# Patient Record
Sex: Female | Born: 1955 | Race: White | Hispanic: No | Marital: Married | Smoking: Former smoker
Health system: Southern US, Community
[De-identification: ages and names within clinical notes are randomized; demographics above are authoritative.]

## PROBLEM LIST (undated history)

## (undated) DIAGNOSIS — E079 Disorder of thyroid, unspecified: Secondary | ICD-10-CM

## (undated) DIAGNOSIS — I10 Essential (primary) hypertension: Secondary | ICD-10-CM

## (undated) DIAGNOSIS — C801 Malignant (primary) neoplasm, unspecified: Secondary | ICD-10-CM

## (undated) DIAGNOSIS — E7800 Pure hypercholesterolemia, unspecified: Secondary | ICD-10-CM

## (undated) HISTORY — PX: BACK SURGERY: SHX140

---

## 1999-11-15 ENCOUNTER — Other Ambulatory Visit: Admission: RE | Admit: 1999-11-15 | Discharge: 1999-11-15 | Payer: Self-pay | Admitting: *Deleted

## 2011-06-24 ENCOUNTER — Emergency Department (INDEPENDENT_AMBULATORY_CARE_PROVIDER_SITE_OTHER): Payer: Self-pay

## 2011-06-24 ENCOUNTER — Emergency Department (HOSPITAL_BASED_OUTPATIENT_CLINIC_OR_DEPARTMENT_OTHER)
Admission: EM | Admit: 2011-06-24 | Discharge: 2011-06-24 | Disposition: A | Discharge status: Home / Self Care | Payer: Self-pay | Attending: Emergency Medicine | Admitting: Emergency Medicine

## 2011-06-24 ENCOUNTER — Encounter: Payer: Self-pay | Admitting: *Deleted

## 2011-06-24 DIAGNOSIS — F17200 Nicotine dependence, unspecified, uncomplicated: Secondary | ICD-10-CM

## 2011-06-24 DIAGNOSIS — E119 Type 2 diabetes mellitus without complications: Secondary | ICD-10-CM | POA: Insufficient documentation

## 2011-06-24 DIAGNOSIS — R509 Fever, unspecified: Secondary | ICD-10-CM

## 2011-06-24 DIAGNOSIS — E7800 Pure hypercholesterolemia, unspecified: Secondary | ICD-10-CM | POA: Insufficient documentation

## 2011-06-24 DIAGNOSIS — E079 Disorder of thyroid, unspecified: Secondary | ICD-10-CM | POA: Insufficient documentation

## 2011-06-24 DIAGNOSIS — R05 Cough: Secondary | ICD-10-CM

## 2011-06-24 DIAGNOSIS — I10 Essential (primary) hypertension: Secondary | ICD-10-CM | POA: Insufficient documentation

## 2011-06-24 DIAGNOSIS — J40 Bronchitis, not specified as acute or chronic: Secondary | ICD-10-CM | POA: Insufficient documentation

## 2011-06-24 DIAGNOSIS — R059 Cough, unspecified: Secondary | ICD-10-CM

## 2011-06-24 DIAGNOSIS — R0989 Other specified symptoms and signs involving the circulatory and respiratory systems: Secondary | ICD-10-CM

## 2011-06-24 DIAGNOSIS — R0602 Shortness of breath: Secondary | ICD-10-CM | POA: Insufficient documentation

## 2011-06-24 HISTORY — DX: Pure hypercholesterolemia, unspecified: E78.00

## 2011-06-24 HISTORY — DX: Essential (primary) hypertension: I10

## 2011-06-24 HISTORY — DX: Disorder of thyroid, unspecified: E07.9

## 2011-06-24 MED ORDER — AZITHROMYCIN 250 MG PO TABS: 250.0000 mg | ORAL_TABLET | Freq: Every day | ORAL | Status: AC

## 2011-06-24 MED ORDER — ALBUTEROL SULFATE (5 MG/ML) 0.5% IN NEBU
INHALATION_SOLUTION | RESPIRATORY_TRACT | Status: AC
  Administered 2011-06-24: 5 mg via RESPIRATORY_TRACT
  Filled 2011-06-24: qty 1

## 2011-06-24 MED ORDER — IPRATROPIUM BROMIDE 0.02 % IN SOLN
RESPIRATORY_TRACT | Status: AC
  Filled 2011-06-24: qty 2.5

## 2011-06-24 MED ORDER — ALBUTEROL SULFATE HFA 108 (90 BASE) MCG/ACT IN AERS: 1.0000 | INHALATION_SPRAY | Freq: Four times a day (QID) | RESPIRATORY_TRACT | Status: AC | PRN

## 2011-06-24 MED ORDER — IPRATROPIUM BROMIDE 0.02 % IN SOLN
0.5000 mg | Freq: Once | RESPIRATORY_TRACT | Status: AC
  Administered 2011-06-24: 0.5 mg via RESPIRATORY_TRACT

## 2011-06-24 MED ORDER — ALBUTEROL SULFATE (5 MG/ML) 0.5% IN NEBU
2.5000 mg | INHALATION_SOLUTION | Freq: Once | RESPIRATORY_TRACT | Status: AC
  Administered 2011-06-24: 5 mg via RESPIRATORY_TRACT

## 2011-06-24 NOTE — ED Provider Notes (Signed)
History     CSN: [id number] Arrival date & time: 06/24/2011  1:08 PM   First MD Initiated Contact with Patient 06/24/11 1315      Chief Complaint  Patient presents with  . Cough  . URI    (Consider location/radiation/quality/duration/timing/severity/associated sxs/prior treatment) Patient is a 55 y.o. female presenting with cough. The history is provided by the patient. No language interpreter was used.  Cough This is a new problem. The current episode started yesterday. The problem occurs constantly. The cough is non-productive. There has been no fever. The fever has been present for less than 1 day. Associated symptoms include chest pain, sore throat, shortness of breath and wheezing. She has tried nothing for the symptoms. The treatment provided mild relief. She is a smoker. Her past medical history is significant for asthma. Her past medical history does not include bronchitis, pneumonia or COPD.  Pt complains of a cough and congestion.  Pt here with spouse who also has the same.  Past Medical History  Diagnosis Date  . Diabetes mellitus   . Thyroid disease   . Hypertension   . Hypercholesteremia     History reviewed. No pertinent past surgical history.  No family history on file.  History  Substance Use Topics  . Smoking status: Current Everyday Smoker -- 0.5 packs/day  . Smokeless tobacco: Not on file  . Alcohol Use:     OB History    Grav Para Term Preterm Abortions TAB SAB Ect Mult Living                  Review of Systems  HENT: Positive for sore throat.   Respiratory: Positive for cough, shortness of breath and wheezing.   Cardiovascular: Positive for chest pain.  All other systems reviewed and are negative.    Allergies  Review of patient's allergies indicates no known allergies.  Home Medications   Current Outpatient Rx  Name Route Sig Dispense Refill  . LEVOTHYROXINE SODIUM 137 MCG PO TABS Oral Take 137 mcg by mouth daily.      Marland Kitchen LISINOPRIL 10  MG PO TABS Oral Take 10 mg by mouth daily.      Marland Kitchen SIMVASTATIN 10 MG PO TABS Oral Take 10 mg by mouth at bedtime.        BP 145/94  Pulse 77  Temp(Src) 98.2 F (36.8 C) (Oral)  Resp 18  SpO2 95%  Physical Exam  Nursing note and vitals reviewed. Constitutional: She is oriented to person, place, and time. She appears well-developed and well-nourished.  HENT:  Head: Normocephalic and atraumatic.  Right Ear: External ear normal.  Left Ear: External ear normal.  Nose: Nose normal.  Mouth/Throat: Oropharynx is clear and moist.  Eyes: Conjunctivae are normal. Pupils are equal, round, and reactive to light.  Neck: Normal range of motion.  Cardiovascular: Normal rate and normal heart sounds.   Pulmonary/Chest: Effort normal. She has wheezes.  Abdominal: Soft.  Musculoskeletal: Normal range of motion.  Neurological: She is alert and oriented to person, place, and time. She has normal reflexes.  Skin: Skin is warm.  Psychiatric: She has a normal mood and affect.    ED Course  Procedures (including critical care time)  Labs Reviewed - No data to display Dg Chest 2 View  06/24/2011  *RADIOLOGY REPORT*  Clinical Data: Cough and fever.  Chest congestion.  Smoker.  CHEST - 2 VIEW  Comparison:  None.  Findings:  The heart size and mediastinal contours are within  normal limits.  Both lungs are clear.  The visualized skeletal structures are unremarkable.  IMPRESSION: No active cardiopulmonary disease.  Original Report Authenticated By: Danae Orleans, M.D.     No diagnosis found.    MDM  Pt given albuterol neb,  Pt reports she feels better,  I advised stop smoking.  Albuterol and zithromax  rx given.         Langston Masker, [place] 06/24/11 1606

## 2011-06-24 NOTE — ED Notes (Signed)
Pt presents to ED today with URI sx x1 week.  Pt has tried OTC remedies with no releif.

## 2011-06-25 NOTE — ED Provider Notes (Signed)
Medical screening examination/treatment/procedure(s) were performed by non-physician practitioner and as supervising physician I was immediately available for consultation/collaboration.   Nat Christen, MD 06/25/11 [phone]

## 2011-07-06 ENCOUNTER — Emergency Department (HOSPITAL_BASED_OUTPATIENT_CLINIC_OR_DEPARTMENT_OTHER)
Admission: EM | Admit: 2011-07-06 | Discharge: 2011-07-06 | Disposition: A | Discharge status: Home / Self Care | Payer: Self-pay | Attending: Emergency Medicine | Admitting: Emergency Medicine

## 2011-07-06 ENCOUNTER — Encounter (HOSPITAL_BASED_OUTPATIENT_CLINIC_OR_DEPARTMENT_OTHER): Payer: Self-pay | Admitting: *Deleted

## 2011-07-06 DIAGNOSIS — E7800 Pure hypercholesterolemia, unspecified: Secondary | ICD-10-CM | POA: Insufficient documentation

## 2011-07-06 DIAGNOSIS — Z79899 Other long term (current) drug therapy: Secondary | ICD-10-CM | POA: Insufficient documentation

## 2011-07-06 DIAGNOSIS — E079 Disorder of thyroid, unspecified: Secondary | ICD-10-CM | POA: Insufficient documentation

## 2011-07-06 DIAGNOSIS — I10 Essential (primary) hypertension: Secondary | ICD-10-CM | POA: Insufficient documentation

## 2011-07-06 DIAGNOSIS — N390 Urinary tract infection, site not specified: Secondary | ICD-10-CM | POA: Insufficient documentation

## 2011-07-06 LAB — URINALYSIS, ROUTINE W REFLEX MICROSCOPIC
Nitrite: NEGATIVE
Protein, ur: 100 mg/dL — AB
Specific Gravity, Urine: 1.018 (ref 1.005–1.030)
Urobilinogen, UA: 0.2 mg/dL (ref 0.0–1.0)

## 2011-07-06 LAB — URINE MICROSCOPIC-ADD ON

## 2011-07-06 MED ORDER — CEPHALEXIN 500 MG PO CAPS: 500.0000 mg | ORAL_CAPSULE | Freq: Four times a day (QID) | ORAL | Status: AC

## 2011-07-06 MED ORDER — PHENAZOPYRIDINE HCL 200 MG PO TABS: 200.0000 mg | ORAL_TABLET | Freq: Three times a day (TID) | ORAL | Status: AC

## 2011-07-06 MED ORDER — PHENAZOPYRIDINE HCL 100 MG PO TABS
ORAL_TABLET | ORAL | Status: AC
  Administered 2011-07-06: 100 mg
  Filled 2011-07-06: qty 1

## 2011-07-06 NOTE — ED Notes (Signed)
Urinary frequency,sharp pains in vaginal area, unable to control urine ,denies abdominal or back pain has been using otc treatments

## 2011-07-06 NOTE — ED Provider Notes (Signed)
History     CSN: [id number]  Arrival date & time 07/06/11  4098   First MD Initiated Contact with Patient 07/06/11 1045      Chief Complaint  Patient presents with  . Urinary Tract Infection    (Consider location/radiation/quality/duration/timing/severity/associated sxs/prior treatment) HPI  Patient complaining of urinary frequency and dysuria for two weeks worse for one week.  Patient states they symptoms began after z-pack.  She has tried monistat twice, yogurt, cranberry sauce,sitz baths without relief.  Patient denies fever and chills and taking ibuprofen for pain.  Patient states she is noting blood in urine now but has not noted vaginal discharge.  No vomiting.     Past Medical History  Diagnosis Date  . Diabetes mellitus   . Thyroid disease   . Hypertension   . Hypercholesteremia     History reviewed. No pertinent past surgical history.  History reviewed. No pertinent family history.  History  Substance Use Topics  . Smoking status: Former Smoker -- 0.5 packs/day  . Smokeless tobacco: Not on file  . Alcohol Use: No    OB History    Grav Para Term Preterm Abortions TAB SAB Ect Mult Living                  Review of Systems  All other systems reviewed and are negative.    Allergies  Review of patient's allergies indicates no known allergies.  Home Medications   Current Outpatient Rx  Name Route Sig Dispense Refill  . ALBUTEROL SULFATE HFA 108 (90 BASE) MCG/ACT IN AERS Inhalation Inhale 1-2 puffs into the lungs every 6 (six) hours as needed for wheezing. 1 Inhaler 0  . LEVOTHYROXINE SODIUM 137 MCG PO TABS Oral Take 137 mcg by mouth daily.      Marland Kitchen LISINOPRIL 10 MG PO TABS Oral Take 10 mg by mouth daily.      Marland Kitchen SIMVASTATIN 10 MG PO TABS Oral Take 10 mg by mouth at bedtime.        BP 153/100  Pulse 96  Temp(Src) 98.7 F (37.1 C) (Oral)  Resp 18  SpO2 100%  Physical Exam  Nursing note and vitals reviewed. Constitutional: She is oriented to  person, place, and time. She appears well-developed and well-nourished.  HENT:  Head: Normocephalic and atraumatic.  Eyes: Conjunctivae and EOM are normal. Pupils are equal, round, and reactive to light.  Neck: Normal range of motion.  Cardiovascular: Normal rate and regular rhythm.   Pulmonary/Chest: Effort normal and breath sounds normal.  Abdominal: Soft. Bowel sounds are normal.  Genitourinary: Vagina normal.  Musculoskeletal: Normal range of motion.  Neurological: She is alert and oriented to person, place, and time. She has normal reflexes.  Skin: Skin is warm and dry.    ED Course  Procedures (including critical care time)   Labs Reviewed  URINALYSIS, ROUTINE W REFLEX MICROSCOPIC   No results found.   No diagnosis found.    MDM          Hilario Quarry, MD 07/06/11 [phone]

## 2013-02-22 IMAGING — CR DG CHEST 2V
2 series · 2 of 2 positions shown · non-contrast
Comparison: None.

CLINICAL DATA: Cough and fever.  Chest congestion.  Smoker.

CHEST - 2 VIEW

[w chest pa]
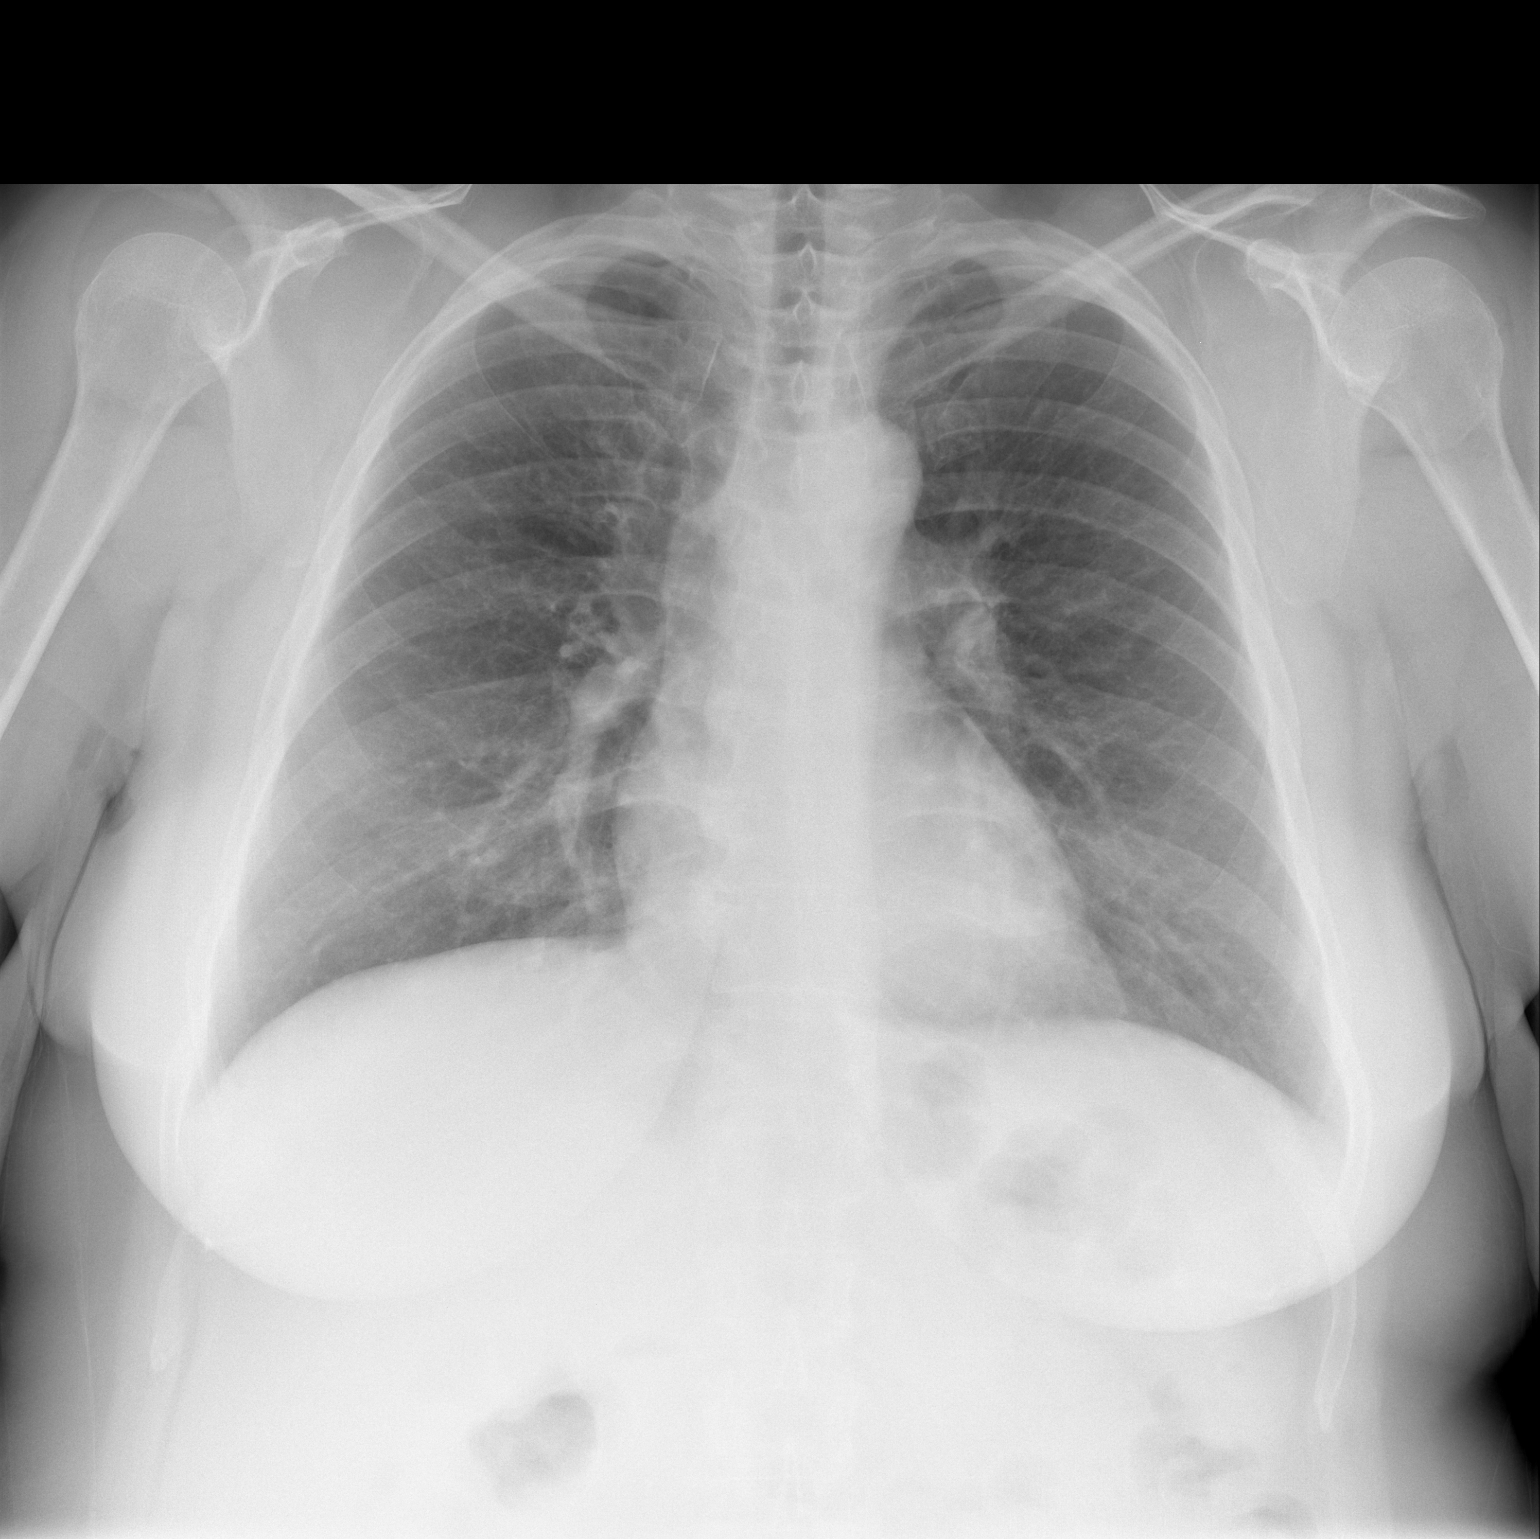

[w chest lat]
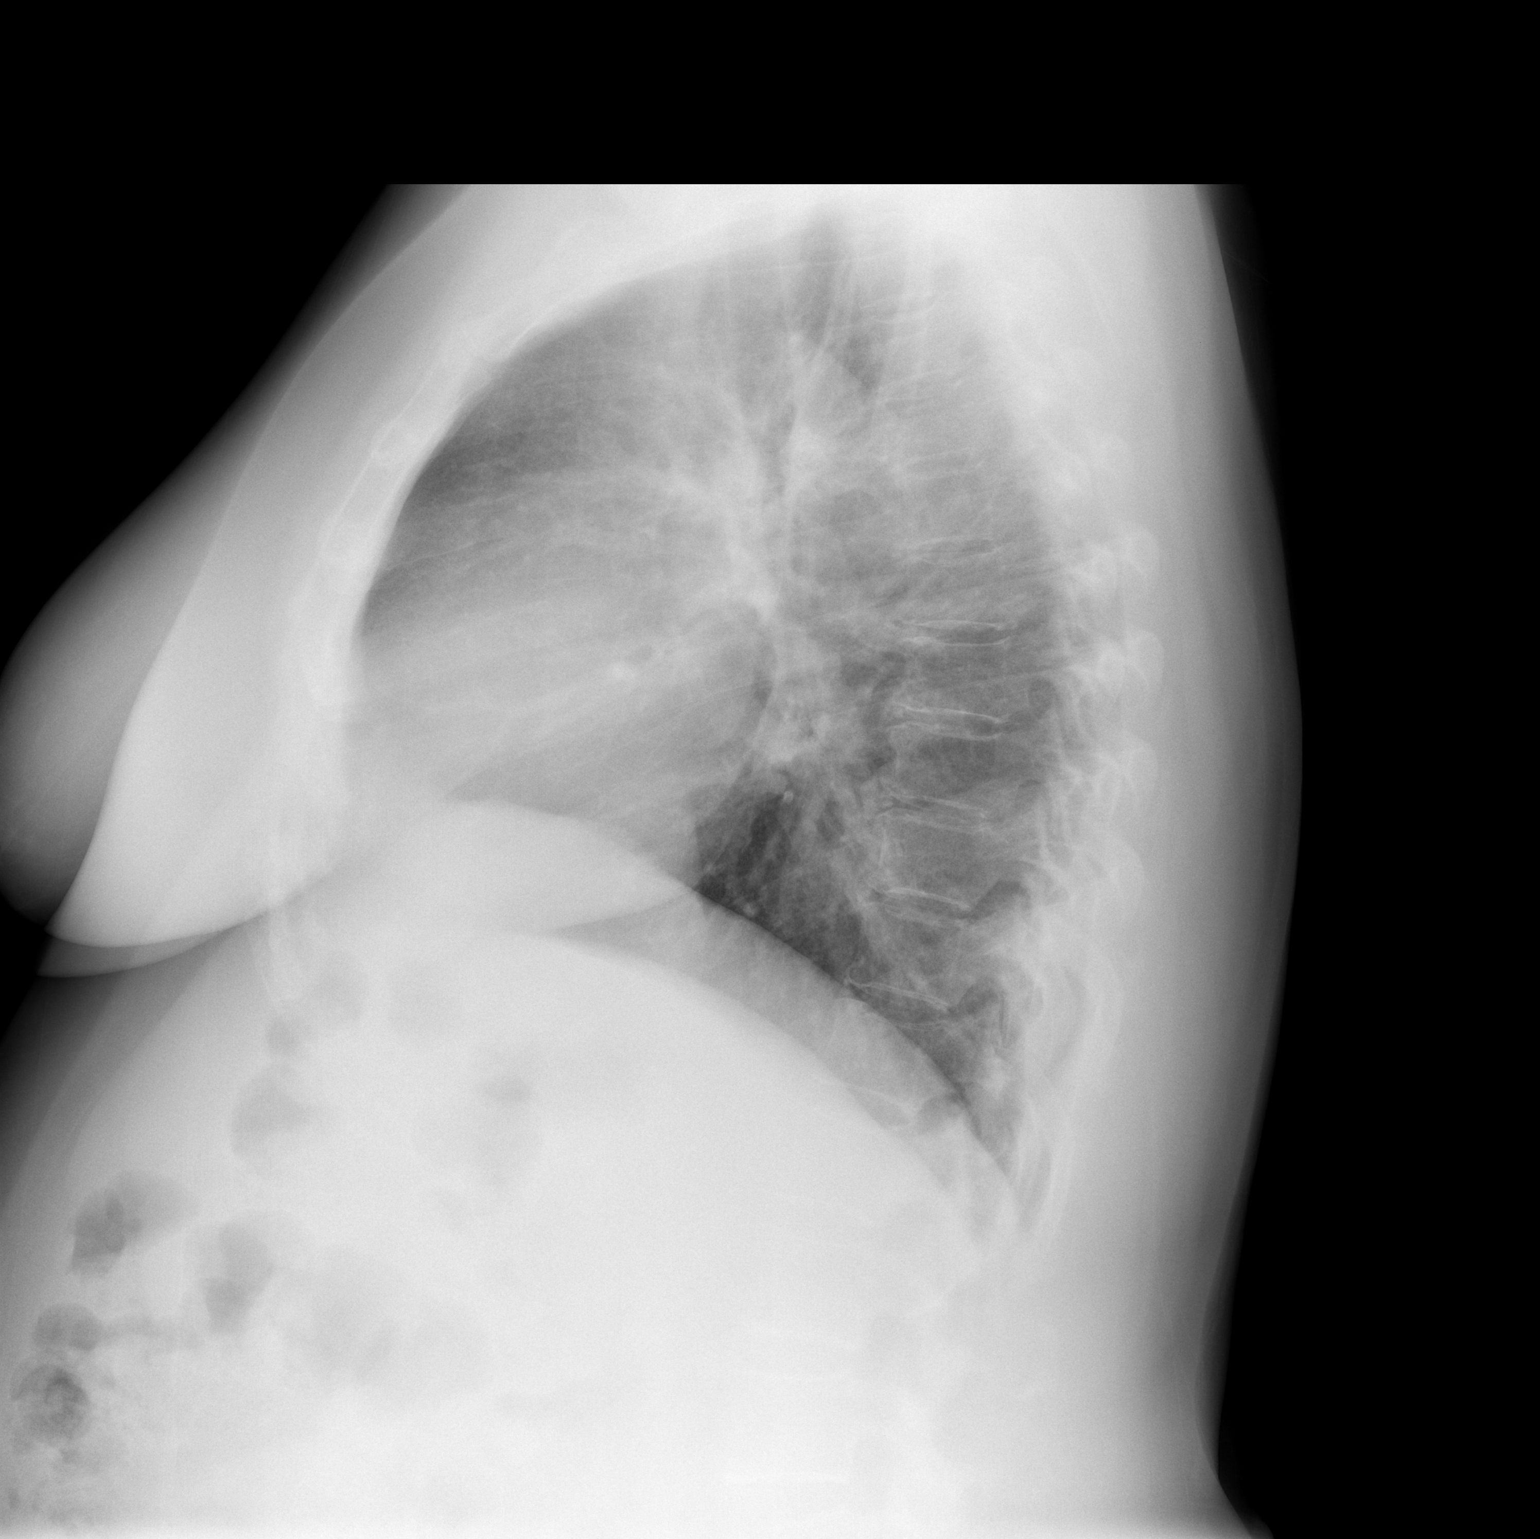

[2 of 2 positions shown; findings below may reference images not displayed]

FINDINGS: The heart size and mediastinal contours are within
normal limits.  Both lungs are clear.  The visualized skeletal
structures are unremarkable.
IMPRESSION: No active cardiopulmonary disease.

## 2014-03-04 DIAGNOSIS — I10 Essential (primary) hypertension: Secondary | ICD-10-CM | POA: Insufficient documentation

## 2014-03-04 DIAGNOSIS — F32A Depression, unspecified: Secondary | ICD-10-CM | POA: Insufficient documentation

## 2014-03-04 DIAGNOSIS — E119 Type 2 diabetes mellitus without complications: Secondary | ICD-10-CM | POA: Insufficient documentation

## 2014-03-04 DIAGNOSIS — E039 Hypothyroidism, unspecified: Secondary | ICD-10-CM | POA: Insufficient documentation

## 2015-04-16 DIAGNOSIS — K5732 Diverticulitis of large intestine without perforation or abscess without bleeding: Secondary | ICD-10-CM | POA: Insufficient documentation

## 2015-12-09 DIAGNOSIS — E669 Obesity, unspecified: Secondary | ICD-10-CM | POA: Insufficient documentation

## 2015-12-24 DIAGNOSIS — R768 Other specified abnormal immunological findings in serum: Secondary | ICD-10-CM | POA: Insufficient documentation

## 2015-12-24 DIAGNOSIS — Z8542 Personal history of malignant neoplasm of other parts of uterus: Secondary | ICD-10-CM | POA: Insufficient documentation

## 2018-08-21 DIAGNOSIS — G629 Polyneuropathy, unspecified: Secondary | ICD-10-CM | POA: Insufficient documentation

## 2019-07-05 ENCOUNTER — Other Ambulatory Visit: Payer: Self-pay

## 2019-07-05 ENCOUNTER — Ambulatory Visit (HOSPITAL_COMMUNITY)
Admission: EM | Admit: 2019-07-05 | Discharge: 2019-07-05 | Disposition: A | Discharge status: Home / Self Care | Payer: Medicare Other | Attending: Emergency Medicine | Admitting: Emergency Medicine

## 2019-07-05 ENCOUNTER — Encounter (HOSPITAL_COMMUNITY): Payer: Self-pay | Admitting: Emergency Medicine

## 2019-07-05 DIAGNOSIS — U071 COVID-19: Secondary | ICD-10-CM | POA: Diagnosis not present

## 2019-07-05 DIAGNOSIS — Z20828 Contact with and (suspected) exposure to other viral communicable diseases: Secondary | ICD-10-CM | POA: Diagnosis present

## 2019-07-05 DIAGNOSIS — Z20822 Contact with and (suspected) exposure to covid-19: Secondary | ICD-10-CM

## 2019-07-05 HISTORY — DX: Malignant (primary) neoplasm, unspecified: C80.1

## 2019-07-05 NOTE — ED Provider Notes (Addendum)
[place]    CSN: [id number] Arrival date & time: 07/05/19  1801      History   Chief Complaint Chief Complaint  Patient presents with  . Labs Only    HPI Tammy Perkins is a 63 y.o. female.   Patient reports to urgent care today for Covid testing due to known positive exposure x 1 week ago. Patient denies symptoms at this time. She wants a Covid test. Denies shortness of breath, chest pain, nausea, vomitin, diarrhea, loss of taste or smell. Patient denies anyone in home is currently sick. She is concerned because of her stage 4 cancer diagnosis.      Past Medical History:  Diagnosis Date  . Cancer ([place])   . Diabetes mellitus   . Hypercholesteremia   . Hypertension   . Thyroid disease     There are no problems to display for this patient.   Past Surgical History:  Procedure Laterality Date  . BACK SURGERY      OB History   No obstetric history on file.      Home Medications    Prior to Admission medications   Medication Sig Start Date End Date Taking? Authorizing Provider  levothyroxine (SYNTHROID, LEVOTHROID) 137 MCG tablet Take 137 mcg by mouth daily.     Yes [provider]  lisinopril (PRINIVIL,ZESTRIL) 10 MG tablet Take 10 mg by mouth daily.     Yes [provider]  simvastatin (ZOCOR) 10 MG tablet Take 10 mg by mouth at bedtime.     Yes [provider]  albuterol (PROVENTIL HFA;VENTOLIN HFA) 108 (90 BASE) MCG/ACT inhaler Inhale 1-2 puffs into the lungs every 6 (six) hours as needed for wheezing. 06/24/11 06/23/12  Fransico Meadow, PA-C    Family History History reviewed. No pertinent family history.  Social History Social History   Tobacco Use  . Smoking status: Former Smoker    Packs/day: 0.50  Substance Use Topics  . Alcohol use: No  . Drug use: No     Allergies   Patient has no known allergies.   Review of Systems Review of Systems  Constitutional: Negative for chills and fever.  HENT:  Negative for congestion, ear pain, postnasal drip, rhinorrhea, sinus pressure, sinus pain and sore throat.   Eyes: Negative for pain and visual disturbance.  Respiratory: Negative for cough and shortness of breath.   Cardiovascular: Negative for chest pain and palpitations.  Gastrointestinal: Negative for abdominal pain and vomiting.  Genitourinary: Negative for dysuria and hematuria.  Musculoskeletal: Negative for arthralgias and back pain.  Skin: Negative for color change and rash.  Neurological: Negative for seizures and syncope.  All other systems reviewed and are negative.    Physical Exam Triage Vital Signs ED Triage Vitals  Enc Vitals Group     BP 07/05/19 1842 120/88     Pulse Rate 07/05/19 1842 89     Resp 07/05/19 1842 20     Temp 07/05/19 1842 98.7 F (37.1 C)     Temp Source 07/05/19 1842 Oral     SpO2 07/05/19 1842 97 %     Weight --      Height --      Head Circumference --      Peak Flow --      Pain Score 07/05/19 1838 0     Pain Loc --      Pain Edu? --      Excl. in [place]? --    No data  found.  Updated Vital Signs BP 120/88 (BP Location: Right Arm) Comment (BP Location): large cuff  Pulse 89   Temp 98.7 F (37.1 C) (Oral)   Resp 20   SpO2 97%   Visual Acuity Right Eye Distance:   Left Eye Distance:   Bilateral Distance:    Right Eye Near:   Left Eye Near:    Bilateral Near:     Physical Exam Constitutional:      General: She is not in acute distress.    Appearance: Normal appearance. She is normal weight. She is not ill-appearing.  HENT:     Head: Normocephalic and atraumatic.  Eyes:     Extraocular Movements: Extraocular movements intact.     Pupils: Pupils are equal, round, and reactive to light.  Neurological:     General: No focal deficit present.     Mental Status: She is alert and oriented to person, place, and time.  Psychiatric:        Mood and Affect: Mood normal.        Behavior: Behavior normal.        Thought Content:  Thought content normal.        Judgment: Judgment normal.      UC Treatments / Results  Labs (all labs ordered are listed, but only abnormal results are displayed) Labs Reviewed  NOVEL CORONAVIRUS, NAA (HOSP ORDER, SEND-OUT TO REF LAB; TAT 18-24 HRS)    EKG   Radiology No results found.  Procedures Procedures (including critical care time)  Medications Ordered in UC Medications - No data to display  Initial Impression / Assessment and Plan / UC Course  I have reviewed the triage vital signs and the nursing notes.  Pertinent labs & imaging results that were available during my care of the patient were reviewed by me and considered in my medical decision making (see chart for details).     Covid Exposure - No symptoms. Well appearing. Precautions given.    Final Clinical Impressions(s) / UC Diagnoses   Final diagnoses:  None     Discharge Instructions     If your Covid-19 test is positive, you will receive a phone call from [facility] regarding your results. Negative test results are not called. Both positive and negative results area always visible on MyChart. If you do not have a MyChart account, sign up instructions are in your discharge papers.   Persons who are directed to care for themselves at home may discontinue isolation under the following conditions:  . At least 10 days have passed since symptom onset and . At least 24 hours have passed without running a fever (this means without the use of fever-reducing medications) and . Other symptoms have improved.  Persons infected with COVID-19 who never develop symptoms may discontinue isolation and other precautions 10 days after the date of their first positive COVID-19 test.   If you develop symptoms and become short of breath, have high fever, severe diarrhea, crushing chest pain, please seek care at the Emergency Department     ED Prescriptions    None     PDMP not reviewed this encounter.     Purnell Shoemaker, PA-C 07/05/19 1917    Willard Madrigal, Marguerita Beards, PA-C 07/05/19 2055

## 2019-07-05 NOTE — Discharge Instructions (Addendum)
If your Covid-19 test is positive, you will receive a phone call from [facility] regarding your results. Negative test results are not called. Both positive and negative results area always visible on MyChart. If you do not have a MyChart account, sign up instructions are in your discharge papers.   Persons who are directed to care for themselves at home may discontinue isolation under the following conditions:   At least 10 days have passed since symptom onset and  At least 24 hours have passed without running a fever (this means without the use of fever-reducing medications) and  Other symptoms have improved.  Persons infected with COVID-19 who never develop symptoms may discontinue isolation and other precautions 10 days after the date of their first positive COVID-19 test.   If you develop symptoms and become short of breath, have high fever, severe diarrhea, crushing chest pain, please seek care at the Emergency Department

## 2019-07-05 NOTE — ED Triage Notes (Signed)
Reports a family friend has tested positive that she has been in contact with

## 2019-07-08 ENCOUNTER — Telehealth: Payer: Self-pay | Admitting: Emergency Medicine

## 2019-07-08 ENCOUNTER — Encounter: Payer: Self-pay | Admitting: Nurse Practitioner

## 2019-07-08 ENCOUNTER — Telehealth: Payer: Self-pay | Admitting: Nurse Practitioner

## 2019-07-08 LAB — NOVEL CORONAVIRUS, NAA (HOSP ORDER, SEND-OUT TO REF LAB; TAT 18-24 HRS): SARS-CoV-2, NAA: DETECTED — AB

## 2019-07-08 NOTE — Telephone Encounter (Signed)
Called to discuss with patient about Covid symptoms and the use of bamlanivimab, a monoclonal antibody infusion for those with mild to moderate Covid symptoms and at a high risk of hospitalization.  Pt is qualified for this infusion at the [facility] infusion center due to Diabetes.  At this time she is not having symptoms, does not qualify at this time.

## 2019-07-08 NOTE — Telephone Encounter (Signed)
Your test for COVID-19 was positive, meaning that you were infected with the novel coronavirus and could give the germ to others.  Please continue isolation at home for at least 10 days since the start of your symptoms. If you do not have symptoms, please isolate at home for 10 days from the day you were tested. Once you complete your 10 day quarantine, you may return to normal activities as long as you've not had a fever for over 24 hours(without taking fever reducing medicine) and your symptoms are improving. Please continue good preventive care measures, including:  frequent hand-washing, avoid touching your face, cover coughs/sneezes, stay out of crowds and keep a 6 foot distance from others.  Go to the nearest hospital emergency room if fever/cough/breathlessness are severe or illness seems like a threat to life.  Patient contacted by phone and made aware of    results. Pt verbalized understanding and had all questions answered.  No symptoms, so quarantine ends Dec 30th.

## 2019-07-09 ENCOUNTER — Other Ambulatory Visit: Payer: Self-pay

## 2019-07-09 ENCOUNTER — Encounter (HOSPITAL_COMMUNITY): Payer: Self-pay | Admitting: Emergency Medicine

## 2019-07-09 ENCOUNTER — Ambulatory Visit (HOSPITAL_COMMUNITY)
Admission: EM | Admit: 2019-07-09 | Discharge: 2019-07-09 | Disposition: A | Discharge status: Home / Self Care | Payer: Medicare Other

## 2019-07-09 DIAGNOSIS — M79642 Pain in left hand: Secondary | ICD-10-CM

## 2019-07-09 DIAGNOSIS — R05 Cough: Secondary | ICD-10-CM | POA: Diagnosis not present

## 2019-07-09 DIAGNOSIS — U071 COVID-19: Secondary | ICD-10-CM

## 2019-07-09 DIAGNOSIS — R0602 Shortness of breath: Secondary | ICD-10-CM | POA: Diagnosis not present

## 2019-07-09 DIAGNOSIS — M792 Neuralgia and neuritis, unspecified: Secondary | ICD-10-CM

## 2019-07-09 MED ORDER — NAPROXEN 500 MG PO TABS: 500.0000 mg | ORAL_TABLET | Freq: Two times a day (BID) | ORAL | 0 refills | Status: AC

## 2019-07-09 NOTE — ED Triage Notes (Addendum)
Patient has forearm pain, shooting pain with use of forearm  Also reports this is the arm that was used for chemo  Patient recently took on the care of a 26 week old baby

## 2019-07-09 NOTE — Discharge Instructions (Addendum)
Take naproxen 2 times a day as directed. If you continue to utilize the ace wrap, be sure you have adequate blood circulation.  Take 500mg  of Vitamin C twice a day. 50mg  of zing 1 time a day and 5000U of vitamin D daily. There is evidence this can help support you through Covid.  You may ice the area as well.  If your shortness of breath worsens, have chest pain, have significant extremity swelling or discoloration please go to the nearest Emergency Department.   Please continue to do your best to isolate. You may stop quaratine 10 days following your symptom onset as long as you do not have a fever for 24 hours at day 10 and all symptoms are improving.

## 2019-07-09 NOTE — ED Provider Notes (Signed)
[facility]    CSN: [id number] Arrival date & time: 07/09/19  1322      History   Chief Complaint Chief Complaint  Patient presents with  . Arm Pain    HPI Tammy Perkins is a 63 y.o. female.   Patient reports to urgent care today for Left hand pain and concern of Covid Symptoms. She was seen in urgent care on 12/19 for covid exposure and testing, she was asymptomatic at the time but has since resulted positive. Today she reports runny nose, cough and mild shortness of breath with exertion when moving trash cans.  She denies shortness of breath at rest or chest pain.   She is also concerned due to shoot left hand pain that started last night. She states this is along her thumb, pointer and middle finger. She also noted some mild swelling in her wrist. She denies this went further up her arm. She wrapped her hand and arm, which helped. She does note a history of carpal tunnel syndrome but has not had an issue in many years. She denies injury to the hand. She denies color change, weakness or loss of feeling.   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    Her medical history is listed below.      Past Medical History:  Diagnosis Date  . Cancer ([place])   . Diabetes mellitus   . Hypercholesteremia   . Hypertension   . Thyroid disease     Patient Active Problem List   Diagnosis Date Noted  . Neuropathy 08/21/2018  . Hepatitis C antibody test positive 12/24/2015  . History of uterine cancer 12/24/2015  . Obesity (BMI 30-39.9) 12/09/2015  . Diverticulitis of large intestine 04/16/2015  . Type 2 diabetes mellitus ([place]) 03/04/2014  . Hypothyroidism 03/04/2014  . Hypertension 03/04/2014  . Depression 03/04/2014    Past Surgical History:  Procedure Laterality Date  . BACK SURGERY      OB History   No obstetric history on file.      Home  Medications    Prior to Admission medications   Medication Sig Start Date End Date Taking? Authorizing Provider  gabapentin (NEURONTIN) 100 MG capsule Take 100 mg by mouth 3 (three) times daily.   Yes [provider]  glipiZIDE (GLUCOTROL) 5 MG tablet Take by mouth daily before breakfast.   Yes [provider]  levothyroxine (SYNTHROID, LEVOTHROID) 137 MCG tablet Take 137 mcg by mouth daily.     Yes [provider]  lisinopril (PRINIVIL,ZESTRIL) 10 MG tablet Take 10 mg by mouth daily.     Yes [provider]  Multiple Vitamin (MULTIVITAMIN) tablet Take 1 tablet by mouth daily.   Yes [provider]  simvastatin (ZOCOR) 10 MG tablet Take 10 mg by mouth at bedtime.     Yes [provider]  valACYclovir HCl (VALTREX PO) Take by mouth.   Yes [provider]  albuterol (PROVENTIL HFA;VENTOLIN HFA) 108 (90 BASE) MCG/ACT inhaler Inhale 1-2 puffs into the lungs every 6 (six) hours as needed for wheezing. 06/24/11 06/23/12  Fransico Meadow, PA-C  naproxen (NAPROSYN) 500 MG tablet Take 1 tablet (500 mg total) by mouth 2 (two) times daily with a meal. 07/09/19   Elfie Costanza, Marguerita Beards, PA-C    Family History History reviewed. No pertinent family history.  Social History Social History  Tobacco Use  . Smoking status: Former Smoker    Packs/day: 0.50  Substance Use Topics  . Alcohol use: No  . Drug use: No     Allergies   Sulfa antibiotics   Review of Systems Review of Systems  Constitutional: Negative for chills and fever.  HENT: Positive for congestion, rhinorrhea and sinus pressure. Negative for ear pain and sore throat.   Eyes: Negative for visual disturbance.  Respiratory: Positive for cough and shortness of breath. Negative for chest tightness and wheezing.   Cardiovascular: Negative for chest pain and palpitations.  Gastrointestinal: Negative for abdominal pain, diarrhea, nausea and vomiting.  Genitourinary: Negative for dysuria  and hematuria.  Musculoskeletal: Positive for myalgias. Negative for arthralgias and back pain.  Skin: Negative for color change and rash.  Neurological: Negative for seizures, syncope, weakness, numbness and headaches.  Hematological: Negative for adenopathy.  All other systems reviewed and are negative.    Physical Exam Triage Vital Signs ED Triage Vitals  Enc Vitals Group     BP 07/09/19 1340 114/75     Pulse Rate 07/09/19 1340 85     Resp 07/09/19 1340 20     Temp 07/09/19 1340 98.2 F (36.8 C)     Temp Source 07/09/19 1340 Oral     SpO2 07/09/19 1340 99 %     Weight --      Height --      Head Circumference --      Peak Flow --      Pain Score 07/09/19 1345 5     Pain Loc --      Pain Edu? --      Excl. in [place]? --    No data found.  Updated Vital Signs BP 114/75 (BP Location: Right Arm)   Pulse 85   Temp 98.2 F (36.8 C) (Oral)   Resp 20   SpO2 99%   Visual Acuity Right Eye Distance:   Left Eye Distance:   Bilateral Distance:    Right Eye Near:   Left Eye Near:    Bilateral Near:     Physical Exam Vitals and nursing note reviewed.  Constitutional:      General: She is not in acute distress.    Appearance: She is well-developed.  HENT:     Head: Normocephalic and atraumatic.  Eyes:     Conjunctiva/sclera: Conjunctivae normal.     Pupils: Pupils are equal, round, and reactive to light.  Cardiovascular:     Rate and Rhythm: Normal rate and regular rhythm.     Heart sounds: No murmur.  Pulmonary:     Effort: Pulmonary effort is normal. No respiratory distress.     Breath sounds: Normal breath sounds. No wheezing, rhonchi or rales.  Abdominal:     Palpations: Abdomen is soft.     Tenderness: There is no abdominal tenderness.  Musculoskeletal:     Cervical back: Neck supple.     Comments: Left Wrist- no sign of edema at current. Mild TTP over lateral forearm musculature .  Tinels positive in left wrist.  Skin:    General: Skin is warm and dry.   Neurological:     General: No focal deficit present.     Mental Status: She is alert and oriented to person, place, and time.     Sensory: No sensory deficit.     Motor: No weakness.  Psychiatric:        Mood and Affect: Mood normal.  Behavior: Behavior normal.        Thought Content: Thought content normal.        Judgment: Judgment normal.      UC Treatments / Results  Labs (all labs ordered are listed, but only abnormal results are displayed) Labs Reviewed - No data to display  EKG   Radiology No results found.  Procedures Procedures (including critical care time)  Medications Ordered in UC Medications - No data to display  Initial Impression / Assessment and Plan / UC Course  I have reviewed the triage vital signs and the nursing notes.  Pertinent labs & imaging results that were available during my care of the patient were reviewed by me and considered in my medical decision making (see chart for details).     # Neuropathic Hand Pain #Covid - Known positive for covid, now showing symptoms. SOB on exertion but saturating at 100% in clinic. No chest pain. History of carpal tunnel, symptoms consistent with this. Unsure of why this restarted now but could be due to covid or increased activity with baby. Recommended naproxen 2 times a day, vitamin C, zinc and vitamin d.  Strict ED precautions discussed for SOB and chest pain   Final Clinical Impressions(s) / UC Diagnoses   Final diagnoses:  Neuropathic pain of left hand  COVID-19     Discharge Instructions     Take naproxen 2 times a day as directed. If you continue to utilize the ace wrap, be sure you have adequate blood circulation.  Take 500mg  of Vitamin C twice a day. 50mg  of zing 1 time a day and 5000U of vitamin D daily. There is evidence this can help support you through Covid.  You may ice the area as well.  If your shortness of breath worsens, have chest pain, have significant extremity  swelling or discoloration please go to the nearest Emergency Department.   Please continue to do your best to isolate. You may stop quaratine 10 days following your symptom onset as long as you do not have a fever for 24 hours at day 10 and all symptoms are improving.       ED Prescriptions    Medication Sig Dispense Auth. Provider   naproxen (NAPROSYN) 500 MG tablet Take 1 tablet (500 mg total) by mouth 2 (two) times daily with a meal. 30 tablet Emrys Mckamie, Marguerita Beards, PA-C     PDMP not reviewed this encounter.   Purnell Shoemaker, PA-C 07/09/19 1454
